# Patient Record
Sex: Female | Born: 1960 | Race: Black or African American | Hispanic: No | Marital: Married | Smoking: Never smoker
Health system: Southern US, Community
[De-identification: ages and names within clinical notes are randomized; demographics above are authoritative.]

## PROBLEM LIST (undated history)

## (undated) DIAGNOSIS — F32A Depression, unspecified: Secondary | ICD-10-CM

## (undated) DIAGNOSIS — I82409 Acute embolism and thrombosis of unspecified deep veins of unspecified lower extremity: Secondary | ICD-10-CM

## (undated) DIAGNOSIS — F419 Anxiety disorder, unspecified: Secondary | ICD-10-CM

## (undated) DIAGNOSIS — M797 Fibromyalgia: Secondary | ICD-10-CM

## (undated) HISTORY — DX: Acute embolism and thrombosis of unspecified deep veins of unspecified lower extremity: I82.409

## (undated) HISTORY — PX: MYOMECTOMY: SHX85

## (undated) HISTORY — PX: KIDNEY STONE SURGERY: SHX686

## (undated) HISTORY — DX: Anxiety disorder, unspecified: F41.9

## (undated) HISTORY — DX: Depression, unspecified: F32.A

## (undated) HISTORY — PX: PARTIAL HYSTERECTOMY: SHX80

---

## 2017-03-17 ENCOUNTER — Encounter: Payer: Self-pay | Admitting: *Deleted

## 2017-04-26 ENCOUNTER — Ambulatory Visit: Payer: Self-pay | Attending: Oncology

## 2017-05-22 DIAGNOSIS — F418 Other specified anxiety disorders: Secondary | ICD-10-CM | POA: Insufficient documentation

## 2017-05-22 DIAGNOSIS — F5104 Psychophysiologic insomnia: Secondary | ICD-10-CM | POA: Insufficient documentation

## 2017-05-22 DIAGNOSIS — M797 Fibromyalgia: Secondary | ICD-10-CM | POA: Insufficient documentation

## 2020-07-06 ENCOUNTER — Emergency Department: Payer: Managed Care, Other (non HMO)

## 2020-07-06 ENCOUNTER — Other Ambulatory Visit: Payer: Self-pay

## 2020-07-06 ENCOUNTER — Emergency Department
Admission: EM | Admit: 2020-07-06 | Discharge: 2020-07-06 | Disposition: A | Discharge status: Home / Self Care | Payer: Managed Care, Other (non HMO) | Attending: Emergency Medicine | Admitting: Emergency Medicine

## 2020-07-06 ENCOUNTER — Encounter: Payer: Self-pay | Admitting: Intensive Care

## 2020-07-06 DIAGNOSIS — R42 Dizziness and giddiness: Secondary | ICD-10-CM | POA: Insufficient documentation

## 2020-07-06 DIAGNOSIS — R0609 Other forms of dyspnea: Secondary | ICD-10-CM | POA: Insufficient documentation

## 2020-07-06 DIAGNOSIS — R000 Tachycardia, unspecified: Secondary | ICD-10-CM | POA: Diagnosis not present

## 2020-07-06 DIAGNOSIS — R0600 Dyspnea, unspecified: Secondary | ICD-10-CM

## 2020-07-06 HISTORY — DX: Fibromyalgia: M79.7

## 2020-07-06 LAB — COMPREHENSIVE METABOLIC PANEL
ALT: 22 U/L (ref 0–44)
AST: 27 U/L (ref 15–41)
Albumin: 4.1 g/dL (ref 3.5–5.0)
Alkaline Phosphatase: 61 U/L (ref 38–126)
Anion gap: 9 (ref 5–15)
BUN: 19 mg/dL (ref 6–20)
CO2: 24 mmol/L (ref 22–32)
Calcium: 9.9 mg/dL (ref 8.9–10.3)
Chloride: 109 mmol/L (ref 98–111)
Creatinine, Ser: 0.91 mg/dL (ref 0.44–1.00)
GFR, Estimated: >60 mL/min (ref >60)
Glucose, Bld: 111 mg/dL — ABNORMAL HIGH (ref 70–99)
Potassium: 3.7 mmol/L (ref 3.5–5.1)
Sodium: 142 mmol/L (ref 135–145)
Total Bilirubin: 0.6 mg/dL (ref 0.3–1.2)
Total Protein: 6.9 g/dL (ref 6.5–8.1)

## 2020-07-06 LAB — CBC WITH DIFFERENTIAL/PLATELET
Abs Immature Granulocytes: 0.01 10*3/uL (ref 0.00–0.07)
Basophils Absolute: 0.1 10*3/uL (ref 0.0–0.1)
Basophils Relative: 1 %
Eosinophils Absolute: 0.5 10*3/uL (ref 0.0–0.5)
Eosinophils Relative: 7 %
HCT: 44.3 % (ref 36.0–46.0)
Hemoglobin: 13.9 g/dL (ref 12.0–15.0)
Immature Granulocytes: 0 %
Lymphocytes Relative: 29 %
Lymphs Abs: 1.8 10*3/uL (ref 0.7–4.0)
MCH: 27.6 pg (ref 26.0–34.0)
MCHC: 31.4 g/dL (ref 30.0–36.0)
MCV: 87.9 fL (ref 80.0–100.0)
Monocytes Absolute: 0.4 10*3/uL (ref 0.1–1.0)
Monocytes Relative: 6 %
Neutro Abs: 3.5 10*3/uL (ref 1.7–7.7)
Neutrophils Relative %: 57 %
Platelets: 170 10*3/uL (ref 150–400)
RBC: 5.04 MIL/uL (ref 3.87–5.11)
RDW: 13.9 % (ref 11.5–15.5)
WBC: 6.2 10*3/uL (ref 4.0–10.5)
nRBC: 0 % (ref 0.0–0.2)

## 2020-07-06 LAB — BRAIN NATRIURETIC PEPTIDE: B Natriuretic Peptide: 6.5 pg/mL (ref 0.0–100.0)

## 2020-07-06 LAB — TROPONIN I (HIGH SENSITIVITY): Troponin I (High Sensitivity): <2 ng/L (ref <18)

## 2020-07-06 LAB — D-DIMER, QUANTITATIVE: D-Dimer, Quant: 0.34 ug/mL-FEU (ref 0.00–0.50)

## 2020-07-06 MED ORDER — ALBUTEROL SULFATE HFA 108 (90 BASE) MCG/ACT IN AERS: 1.0000 | INHALATION_SPRAY | Freq: Four times a day (QID) | RESPIRATORY_TRACT | 0 refills | Status: DC | PRN

## 2020-07-06 NOTE — ED Triage Notes (Addendum)
Patient c/o SOB and dizziness. Reports she was sent back MD due to HX of DVTs. Denies pain. 

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
## 2020-12-14 DIAGNOSIS — N3281 Overactive bladder: Secondary | ICD-10-CM | POA: Insufficient documentation

## 2020-12-14 DIAGNOSIS — G43009 Migraine without aura, not intractable, without status migrainosus: Secondary | ICD-10-CM | POA: Insufficient documentation

## 2020-12-14 DIAGNOSIS — Z86718 Personal history of other venous thrombosis and embolism: Secondary | ICD-10-CM | POA: Insufficient documentation

## 2020-12-14 DIAGNOSIS — Z90710 Acquired absence of both cervix and uterus: Secondary | ICD-10-CM | POA: Insufficient documentation

## 2022-03-30 ENCOUNTER — Emergency Department (HOSPITAL_COMMUNITY): Payer: 59

## 2022-03-30 ENCOUNTER — Emergency Department (HOSPITAL_COMMUNITY)
Admission: EM | Admit: 2022-03-30 | Discharge: 2022-03-30 | Disposition: A | Discharge status: Home / Self Care | Payer: 59 | Attending: Emergency Medicine | Admitting: Emergency Medicine

## 2022-03-30 ENCOUNTER — Encounter (HOSPITAL_COMMUNITY): Payer: Self-pay

## 2022-03-30 DIAGNOSIS — R350 Frequency of micturition: Secondary | ICD-10-CM | POA: Diagnosis not present

## 2022-03-30 DIAGNOSIS — M549 Dorsalgia, unspecified: Secondary | ICD-10-CM | POA: Insufficient documentation

## 2022-03-30 DIAGNOSIS — N200 Calculus of kidney: Secondary | ICD-10-CM

## 2022-03-30 DIAGNOSIS — R109 Unspecified abdominal pain: Secondary | ICD-10-CM | POA: Diagnosis present

## 2022-03-30 DIAGNOSIS — G8929 Other chronic pain: Secondary | ICD-10-CM | POA: Diagnosis not present

## 2022-03-30 DIAGNOSIS — N281 Cyst of kidney, acquired: Secondary | ICD-10-CM

## 2022-03-30 DIAGNOSIS — R1030 Lower abdominal pain, unspecified: Secondary | ICD-10-CM | POA: Diagnosis not present

## 2022-03-30 LAB — COMPREHENSIVE METABOLIC PANEL
ALT: 20 U/L (ref 0–44)
AST: 20 U/L (ref 15–41)
Albumin: 4.2 g/dL (ref 3.5–5.0)
Alkaline Phosphatase: 56 U/L (ref 38–126)
Anion gap: 8 (ref 5–15)
BUN: 21 mg/dL (ref 8–23)
CO2: 27 mmol/L (ref 22–32)
Calcium: 9.8 mg/dL (ref 8.9–10.3)
Chloride: 105 mmol/L (ref 98–111)
Creatinine, Ser: 0.94 mg/dL (ref 0.44–1.00)
GFR, Estimated: >60 mL/min (ref >60)
Glucose, Bld: 97 mg/dL (ref 70–99)
Potassium: 3.8 mmol/L (ref 3.5–5.1)
Sodium: 140 mmol/L (ref 135–145)
Total Bilirubin: 0.4 mg/dL (ref 0.3–1.2)
Total Protein: 6.8 g/dL (ref 6.5–8.1)

## 2022-03-30 LAB — CBC WITH DIFFERENTIAL/PLATELET
Abs Immature Granulocytes: 0.02 10*3/uL (ref 0.00–0.07)
Basophils Absolute: 0 10*3/uL (ref 0.0–0.1)
Basophils Relative: 1 %
Eosinophils Absolute: 0.2 10*3/uL (ref 0.0–0.5)
Eosinophils Relative: 3 %
HCT: 48.4 % — ABNORMAL HIGH (ref 36.0–46.0)
Hemoglobin: 14.9 g/dL (ref 12.0–15.0)
Immature Granulocytes: 0 %
Lymphocytes Relative: 20 %
Lymphs Abs: 1.2 10*3/uL (ref 0.7–4.0)
MCH: 27.6 pg (ref 26.0–34.0)
MCHC: 30.8 g/dL (ref 30.0–36.0)
MCV: 89.8 fL (ref 80.0–100.0)
Monocytes Absolute: 0.4 10*3/uL (ref 0.1–1.0)
Monocytes Relative: 6 %
Neutro Abs: 4.4 10*3/uL (ref 1.7–7.7)
Neutrophils Relative %: 70 %
Platelets: 201 10*3/uL (ref 150–400)
RBC: 5.39 MIL/uL — ABNORMAL HIGH (ref 3.87–5.11)
RDW: 13.5 % (ref 11.5–15.5)
WBC: 6.2 10*3/uL (ref 4.0–10.5)
nRBC: 0 % (ref 0.0–0.2)

## 2022-03-30 LAB — URINALYSIS, ROUTINE W REFLEX MICROSCOPIC
Bilirubin Urine: NEGATIVE
Glucose, UA: NEGATIVE mg/dL
Hgb urine dipstick: NEGATIVE
Ketones, ur: NEGATIVE mg/dL
Leukocytes,Ua: NEGATIVE
Nitrite: NEGATIVE
Protein, ur: NEGATIVE mg/dL
Specific Gravity, Urine: 1.018 (ref 1.005–1.030)
pH: 6 (ref 5.0–8.0)

## 2022-03-30 MED ORDER — HYDROCODONE-ACETAMINOPHEN 5-325 MG PO TABS
1.0000 | ORAL_TABLET | Freq: Once | ORAL | Status: AC
  Administered 2022-03-30: 1 via ORAL
  Filled 2022-03-30: qty 1

## 2022-03-30 MED ORDER — KETOROLAC TROMETHAMINE 60 MG/2ML IM SOLN
60.0000 mg | Freq: Once | INTRAMUSCULAR | Status: AC
  Administered 2022-03-30: 60 mg via INTRAMUSCULAR
  Filled 2022-03-30: qty 2

## 2022-03-30 MED ORDER — TRAMADOL HCL 50 MG PO TABS: 50.0000 mg | ORAL_TABLET | Freq: Four times a day (QID) | ORAL | 0 refills | Status: DC | PRN

## 2022-03-30 NOTE — ED Provider Notes (Signed)
[facility] DEPT Provider Note   CSN: [id number] Arrival date & time: 03/30/22  1356     History  Chief Complaint  Patient presents with   Back Pain    Cristina Mcdonald is a 62 y.o. female history of kidney stones here presenting with flank pain.  Patient has history of fibromyalgia also is in pain all the time.  She states that for the last several days she has worsening flank pain radiated down to the groin.  She has some urinary frequency as well.  Patient denies any hematuria.  She states that she has previous kidney stones and interventions done before that made her septic.  She moved here from [place] and does not have a urologist.  The history is provided by the patient.       Home Medications Prior to Admission medications   Medication Sig Start Date End Date Taking? Authorizing Provider  albuterol (VENTOLIN HFA) 108 (90 Base) MCG/ACT inhaler Inhale 1-2 puffs into the lungs every 6 (six) hours as needed for wheezing or shortness of breath. 07/06/20   Vladimir Crofts, MD      Allergies    Iodine and Shellfish allergy    Review of Systems   Review of Systems  Musculoskeletal:  Positive for back pain.  All other systems reviewed and are negative.   Physical Exam Updated Vital Signs BP 114/78 (BP Location: Left Arm)   Pulse 87   Temp 98.6 F (37 C) (Oral)   Resp 16   SpO2 98%  Physical Exam Vitals and nursing note reviewed.  Constitutional:      Appearance: Normal appearance.  HENT:     Head: Normocephalic.     Nose: Nose normal.     Mouth/Throat:     Mouth: Mucous membranes are moist.  Eyes:     Extraocular Movements: Extraocular movements intact.     Pupils: Pupils are equal, round, and reactive to light.  Cardiovascular:     Rate and Rhythm: Normal rate and regular rhythm.     Pulses: Normal pulses.     Heart sounds: Normal heart sounds.  Pulmonary:     Effort: Pulmonary effort is normal.     Breath sounds: Normal breath  sounds.  Abdominal:     General: Abdomen is flat.     Palpations: Abdomen is soft.     Comments: Mild suprapubic tenderness no obvious CVA tenderness  Musculoskeletal:     Cervical back: Normal range of motion and neck supple.  Skin:    General: Skin is warm.     Capillary Refill: Capillary refill takes less than 2 seconds.  Neurological:     General: No focal deficit present.     Mental Status: She is alert.  Psychiatric:        Mood and Affect: Mood normal.        Behavior: Behavior normal.     ED Results / Procedures / Treatments   Labs (all labs ordered are listed, but only abnormal results are displayed) Labs Reviewed  CBC WITH DIFFERENTIAL/PLATELET - Abnormal; Notable for the following components:      Result Value   RBC 5.39 (*)    HCT 48.4 (*)    All other components within normal limits  URINE CULTURE  COMPREHENSIVE METABOLIC PANEL  URINALYSIS, ROUTINE W REFLEX MICROSCOPIC    EKG None  Radiology CT Renal Stone Study  Result Date: 03/30/2022 CLINICAL DATA:  Back and bilateral flank pain, dysuria, and urgency EXAM:  CT ABDOMEN AND PELVIS WITHOUT CONTRAST TECHNIQUE: Multidetector CT imaging of the abdomen and pelvis was performed following the standard protocol without IV contrast. RADIATION DOSE REDUCTION: This exam was performed according to the departmental dose-optimization program which includes automated exposure control, adjustment of the mA and/or kV according to patient size and/or use of iterative reconstruction technique. COMPARISON:  None Available. FINDINGS: Lower chest: Right lower lobe subsegmental atelectasis. No pleural effusion or pneumothorax demonstrated. Partially imaged heart size is normal. Hepatobiliary: No focal hepatic lesions. No intra or extrahepatic biliary ductal dilation. Normal gallbladder. Pancreas: No focal lesions or main ductal dilation. Spleen: The superior most aspect of the spleen is not included within the field of view. No focal  splenic lesion in the remainder of the partially imaged spleen. Adrenals/Urinary Tract: No adrenal nodules. No hydronephrosis. Asymmetrically smaller right kidney with cortical scarring. Multiple bilateral punctate nonobstructing renal stones. Bilateral hypodensities, most likely cysts. An exophytic hyperattenuating focus along the posterior left lower pole, likely hemorrhagic/proteinaceous cyst. No focal bladder wall thickening. Stomach/Bowel: Normal appearance of the stomach. No evidence of bowel wall thickening, distention, or inflammatory changes. Normal appendix. Vascular/Lymphatic: No significant vascular findings are present. No enlarged abdominal or pelvic lymph nodes. Reproductive: No adnexal masses. Other: No free fluid, fluid collection, or free air. Musculoskeletal: Grade 1 anterolisthesis at L5-S1. IMPRESSION: 1. No acute intra-abdominal or intrapelvic abnormality. 2. Bilateral punctate nonobstructing renal stones. 3. Asymmetrically smaller right kidney with cortical scarring, likely sequela of prior infection. 4. Bilateral renal hypodensities, most likely cysts. Nonemergent renal protocol MRI or CT can be considered for further evaluation. 5. Grade 1 anterolisthesis at L5-S1. 6. The superior most aspect of the spleen is not included within the field of view. No focal splenic lesion in the remainder of the partially imaged spleen. Electronically Signed   By: Darrin Nipper M.D.   On: 03/30/2022 15:26    Procedures Procedures    Medications Ordered in ED Medications  ketorolac (TORADOL) injection 60 mg (has no administration in time range)  HYDROcodone-acetaminophen (NORCO/VICODIN) 5-325 MG per tablet 1 tablet (1 tablet Oral Given 03/30/22 1641)    ED Course/ Medical Decision Making/ A&P                           Medical Decision Making Cristina Mcdonald is a 62 y.o. female here presenting with dysuria and flank pain.  Patient has chronic back pain from her fibromyalgia.  She has been having  worsening urinary frequency.  Patient has history of kidney stones.  Concern for possible kidney stones versus UTI.  Plan to get CBC and CMP and UA and CT renal stone.  5:13 PM I reviewed patient's labs and independently interpreted imaging studies.  CBC and CMP unremarkable.  Urinalysis unremarkable.  CT showed bilateral punctate nonobstructing stones with no hydro-.  Patient also has cysts in her kidney.  I wonder if she passed a kidney stone and I recommend NSAIDs.  Will refer to [facility].  Problems Addressed: Kidney stone: chronic illness or injury Renal cyst: chronic illness or injury  Amount and/or Complexity of Data Reviewed Labs: ordered. Decision-making details documented in ED Course. Radiology: ordered and independent interpretation performed. Decision-making details documented in ED Course.  Risk Prescription drug management.    Final Clinical Impression(s) / ED Diagnoses Final diagnoses:  None    Rx / DC Orders ED Discharge Orders     None         Darl Householder,  Gonzella Lex, MD 03/30/22 [phone]

## 2022-03-30 NOTE — Discharge Instructions (Signed)
As we discussed, you have stones inside your kidney which is not likely causing your pain.  You may have passed some small kidney stones that was causing your pain.  I recommend taking Motrin for pain and Ultram for severe pain.  I have referred you to urology for follow-up.  Please call their number tomorrow  Return to ER if you have severe back pain, trouble urinating, blood in your urine, fever

## 2022-03-30 NOTE — ED Provider Triage Note (Signed)
Emergency Medicine Provider Triage Evaluation Note  Cristina Mcdonald , a 62 y.o. female  was evaluated in triage.  Pt complains of back and flank pain bilaterally, dysuria, urgency. No hematuria. Hx of kidney stones.  Review of Systems  Positive: Urinary urgency, frequency Negative: hematuria  Physical Exam  BP 114/78 (BP Location: Left Arm)   Pulse 87   Temp 98.6 F (37 C) (Oral)   Resp 16   SpO2 98%  Gen:   Awake, no distress   Resp:  Normal effort  MSK:   Moves extremities without difficulty  Other:  Ttp of bilateral flanks  Medical Decision Making  Medically screening exam initiated at 2:11 PM.  Appropriate orders placed.  Cristina Mcdonald was informed that the remainder of the evaluation will be completed by another provider, this initial triage assessment does not replace that evaluation, and the importance of remaining in the ED until their evaluation is complete.     Cristina Mcdonald, [place] 03/30/22 1413

## 2022-03-30 NOTE — ED Notes (Signed)
Pt arrived via POV, c/o back pain, and abd pain with dysuria and urgency frequency. Denies any unilateral flank pain, denies any blood in urine

## 2022-03-31 LAB — URINE CULTURE: Culture: <10000 — AB

## 2022-05-23 IMAGING — CR DG CHEST 2V
1 series · 2 of 2 positions shown · non-contrast
Comparison: None.

CLINICAL DATA: Shortness of breath. Known DVT. Pulmonary
congestion.

EXAM:
CHEST - 2 VIEW

[Series 1: w chest pa · 0.14mm/px · 2 of 2 slices shown]
[im 1/2]
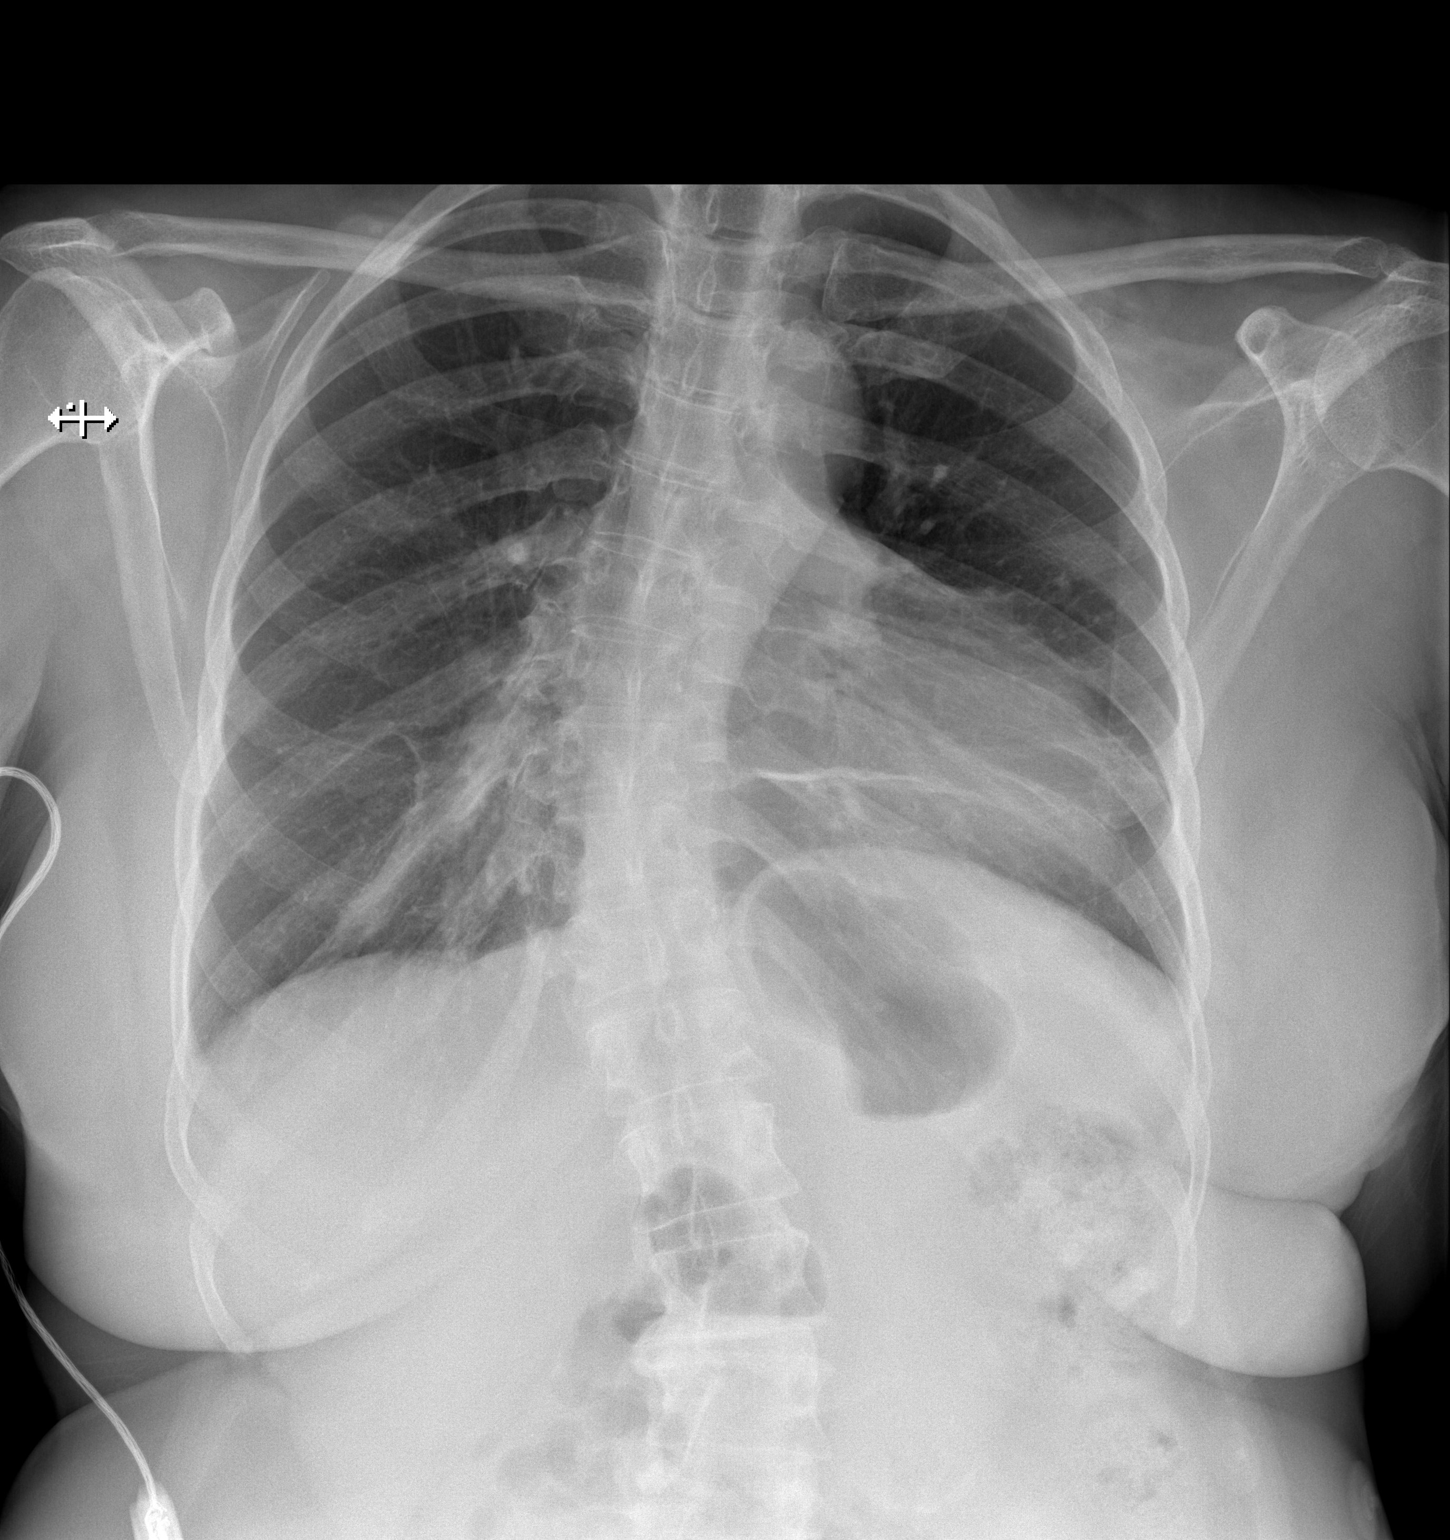
[im 2/2]
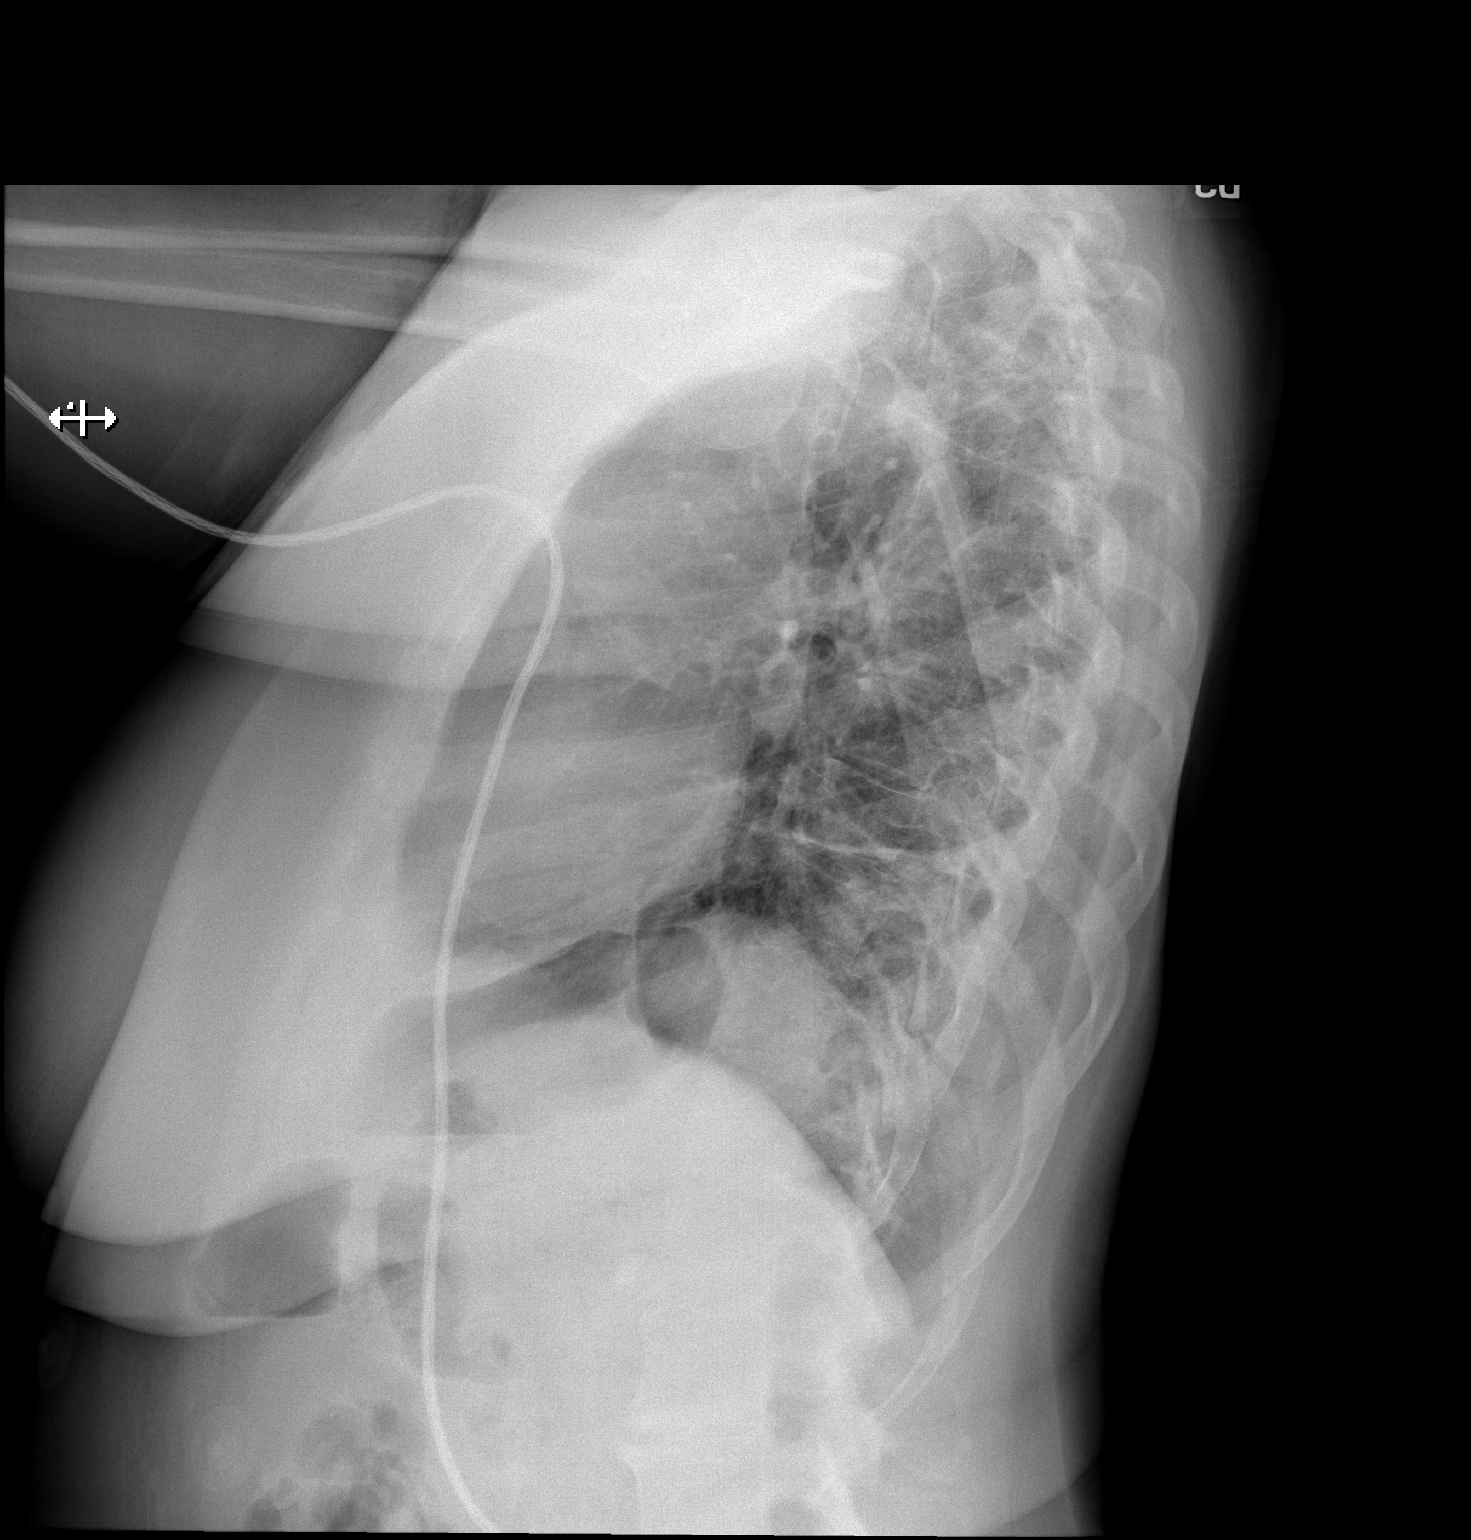

[2 of 2 positions shown; findings below may reference images not displayed]

FINDINGS: Heart is mildly enlarged. Focal airspace opacity is noted in the
posteromedial left lower lobe and medial right lower lobe. Upper
lobes are clear bilaterally. Dextroconvex scoliosis is present in
the thoracic spine.
IMPRESSION: 1. Bilateral lower lobe airspace disease. Differential diagnosis
includes atelectasis, potentially associated with pulmonary emboli,
infection, and aspiration.
2. Mild cardiomegaly without failure.

## 2022-07-18 DIAGNOSIS — M47812 Spondylosis without myelopathy or radiculopathy, cervical region: Secondary | ICD-10-CM | POA: Insufficient documentation

## 2022-09-26 ENCOUNTER — Ambulatory Visit: Payer: 59 | Admitting: Family Medicine

## 2022-10-05 ENCOUNTER — Ambulatory Visit: Payer: 59 | Admitting: Internal Medicine

## 2023-02-03 ENCOUNTER — Emergency Department (HOSPITAL_COMMUNITY): Payer: 59

## 2023-02-03 ENCOUNTER — Other Ambulatory Visit: Payer: Self-pay

## 2023-02-03 ENCOUNTER — Encounter (HOSPITAL_COMMUNITY): Payer: Self-pay | Admitting: Emergency Medicine

## 2023-02-03 ENCOUNTER — Emergency Department (HOSPITAL_BASED_OUTPATIENT_CLINIC_OR_DEPARTMENT_OTHER): Payer: 59

## 2023-02-03 ENCOUNTER — Emergency Department (HOSPITAL_COMMUNITY)
Admission: EM | Admit: 2023-02-03 | Discharge: 2023-02-03 | Disposition: A | Discharge status: Home / Self Care | Payer: 59

## 2023-02-03 DIAGNOSIS — R202 Paresthesia of skin: Secondary | ICD-10-CM | POA: Insufficient documentation

## 2023-02-03 DIAGNOSIS — S39012A Strain of muscle, fascia and tendon of lower back, initial encounter: Secondary | ICD-10-CM | POA: Insufficient documentation

## 2023-02-03 DIAGNOSIS — S161XXA Strain of muscle, fascia and tendon at neck level, initial encounter: Secondary | ICD-10-CM | POA: Insufficient documentation

## 2023-02-03 DIAGNOSIS — M79604 Pain in right leg: Secondary | ICD-10-CM | POA: Insufficient documentation

## 2023-02-03 DIAGNOSIS — X58XXXA Exposure to other specified factors, initial encounter: Secondary | ICD-10-CM | POA: Insufficient documentation

## 2023-02-03 DIAGNOSIS — M7989 Other specified soft tissue disorders: Secondary | ICD-10-CM | POA: Diagnosis not present

## 2023-02-03 DIAGNOSIS — M79605 Pain in left leg: Secondary | ICD-10-CM | POA: Diagnosis not present

## 2023-02-03 DIAGNOSIS — M79601 Pain in right arm: Secondary | ICD-10-CM | POA: Insufficient documentation

## 2023-02-03 DIAGNOSIS — M79602 Pain in left arm: Secondary | ICD-10-CM | POA: Diagnosis not present

## 2023-02-03 DIAGNOSIS — S199XXA Unspecified injury of neck, initial encounter: Secondary | ICD-10-CM | POA: Diagnosis present

## 2023-02-03 LAB — BASIC METABOLIC PANEL
Anion gap: 6 (ref 5–15)
BUN: 19 mg/dL (ref 8–23)
CO2: 30 mmol/L (ref 22–32)
Calcium: 10.6 mg/dL — ABNORMAL HIGH (ref 8.9–10.3)
Chloride: 103 mmol/L (ref 98–111)
Creatinine, Ser: 0.82 mg/dL (ref 0.44–1.00)
GFR, Estimated: >60 mL/min (ref >60)
Glucose, Bld: 96 mg/dL (ref 70–99)
Potassium: 5 mmol/L (ref 3.5–5.1)
Sodium: 139 mmol/L (ref 135–145)

## 2023-02-03 LAB — CBC
HCT: 48.3 % — ABNORMAL HIGH (ref 36.0–46.0)
Hemoglobin: 15 g/dL (ref 12.0–15.0)
MCH: 27.4 pg (ref 26.0–34.0)
MCHC: 31.1 g/dL (ref 30.0–36.0)
MCV: 88.3 fL (ref 80.0–100.0)
Platelets: 162 10*3/uL (ref 150–400)
RBC: 5.47 MIL/uL — ABNORMAL HIGH (ref 3.87–5.11)
RDW: 13.6 % (ref 11.5–15.5)
WBC: 5.3 10*3/uL (ref 4.0–10.5)
nRBC: 0 % (ref 0.0–0.2)

## 2023-02-03 MED ORDER — NAPROXEN 500 MG PO TABS: 500.0000 mg | ORAL_TABLET | Freq: Two times a day (BID) | ORAL | 0 refills | Status: DC

## 2023-02-03 MED ORDER — OXYCODONE-ACETAMINOPHEN 5-325 MG PO TABS
1.0000 | ORAL_TABLET | Freq: Once | ORAL | Status: AC
  Administered 2023-02-03: 1 via ORAL
  Filled 2023-02-03: qty 1

## 2023-02-03 MED ORDER — METHOCARBAMOL 500 MG PO TABS: 500.0000 mg | ORAL_TABLET | Freq: Two times a day (BID) | ORAL | 0 refills | Status: DC

## 2023-02-03 MED ORDER — NAPROXEN 500 MG PO TABS
500.0000 mg | ORAL_TABLET | Freq: Two times a day (BID) | ORAL | 0 refills | Status: DC
Start: 2023-02-03 — End: 2023-02-03

## 2023-02-03 NOTE — ED Triage Notes (Addendum)
Patient presents with bilateral leg pain, she has had for 3 weeks, that keeps her from sleeping at night. The pain radiates down her legs and leads to tingling in the feet. She can not lay on either side and has heaviness in her calves.  She also complains of pain in the right side of her neck and mid to lower back. She has seen a therapist about her neck, but stopped going. She was told she needed an MRI.  Denies shortness of breath.  HX: blood clots, kidney stones

## 2023-02-03 NOTE — ED Provider Notes (Signed)
EMERGENCY DEPARTMENT AT [facility] Provider Note   CSN: [id number] Arrival date & time: 02/03/23  1208     History  Chief Complaint  Patient presents with   Leg Pain   Back Pain   Neck Pain    Cristina Mcdonald is a 62 y.o. female.  62 year old female with past medical history of DVT in the past as well as fibromyalgia presenting to the emergency department today with pain in her neck and back as well as some paresthesias.  The patient states that this is been going on now for the past few weeks.  She was going to physical therapy for her neck.  She was told that she would need to obtain imaging before this could continue due to pain.  The patient denies any bowel or bladder dysfunction and has not had any saddle anesthesia.  She denies any focal weakness but states that her legs do feel weak compared to her baseline at times.  She denies any saddle anesthesia.  She states that she does have numbness and tingling in her hands and feet occasionally.  She does take gabapentin and has been taking tramadol for this with minimal improvement.  The patient denies any history of IV drug abuse.  Skin to the ER today for further evaluation due to these ongoing symptoms.   Leg Pain Associated symptoms: back pain and neck pain   Back Pain Associated symptoms: leg pain and numbness   Neck Pain Associated symptoms: leg pain and numbness        Home Medications Prior to Admission medications   Medication Sig Start Date End Date Taking? Authorizing Provider  methocarbamol (ROBAXIN) 500 MG tablet Take 1 tablet (500 mg total) by mouth 2 (two) times daily. 02/03/23  Yes Durwin Glaze, MD  naproxen (NAPROSYN) 500 MG tablet Take 1 tablet (500 mg total) by mouth 2 (two) times daily. 02/03/23  Yes Durwin Glaze, MD  albuterol (VENTOLIN HFA) 108 (90 Base) MCG/ACT inhaler Inhale 1-2 puffs into the lungs every 6 (six) hours as needed for wheezing or shortness of breath. 07/06/20    Delton Prairie, MD  traMADol (ULTRAM) 50 MG tablet Take 1 tablet (50 mg total) by mouth every 6 (six) hours as needed. 03/30/22   Charlynne Pander, MD      Allergies    Iodine and Shellfish allergy    Review of Systems   Review of Systems  Musculoskeletal:  Positive for back pain and neck pain.  Neurological:  Positive for numbness.  All other systems reviewed and are negative.   Physical Exam Updated Vital Signs BP (!) 128/90 (BP Location: Left Arm)   Pulse 80   Temp 98.1 F (36.7 C) (Oral)   Resp 16   SpO2 93%  Physical Exam Vitals and nursing note reviewed.   Gen: NAD Eyes: PERRL, EOMI HEENT: no oropharyngeal swelling Neck: trachea midline Resp: clear to auscultation bilaterally Card: RRR, no murmurs, rubs, or gallops Abd: nontender, nondistended Extremities: The patient does report bilateral calf tenderness MSK: The patient is tender over the mid cervical spine as well as the mid lumbar spine with no step-offs or deformities noted Vascular: 2+ radial pulses bilaterally, 2+ DP pulses bilaterally Neuro: Patient has equal strength sensation throughout the bilateral upper and lower extremities with no dysmetria on finger-to-nose testing Skin: no rashes Psyc: acting appropriately   ED Results / Procedures / Treatments   Labs (all labs ordered are listed, but only abnormal results  are displayed) Labs Reviewed  BASIC METABOLIC PANEL - Abnormal; Notable for the following components:      Result Value   Calcium 10.6 (*)    All other components within normal limits  CBC - Abnormal; Notable for the following components:   RBC 5.47 (*)    HCT 48.3 (*)    All other components within normal limits    EKG None  Radiology VAS [place] LOWER EXTREMITY VENOUS (DVT) (ONLY [facility] & [facility])  Result Date: 02/03/2023  Lower Venous DVT Study Patient Name:  Cristina Mcdonald  Date of Exam:   02/03/2023 Medical Rec #: [id number]       Accession #:    [id number] Date of Birth: 30-Oct-1960        Patient Gender: F Patient Age:   45 years Exam Location:  [facility] Procedure:      VAS [place] LOWER EXTREMITY VENOUS (DVT) Referring Phys: Greig Castilla Geniva Lohnes --------------------------------------------------------------------------------  Indications: Pain.  Risk Factors: DVT HX of DVT post trauma 20 years ago per patient. Comparison Study: Previous exam on 03/02/2012 was negative for DVT Performing Technologist: Ernestene Mention RVT, RDMS  Examination Guidelines: A complete evaluation includes B-mode imaging, spectral Doppler, color Doppler, and power Doppler as needed of all accessible portions of each vessel. Bilateral testing is considered an integral part of a complete examination. Limited examinations for reoccurring indications may be performed as noted. The reflux portion of the exam is performed with the patient in reverse Trendelenburg.  +---------+---------------+---------+-----------+----------+--------------+ RIGHT    CompressibilityPhasicitySpontaneityPropertiesThrombus Aging +---------+---------------+---------+-----------+----------+--------------+ CFV      Full           Yes      Yes                                 +---------+---------------+---------+-----------+----------+--------------+ SFJ      Full                                                        +---------+---------------+---------+-----------+----------+--------------+ FV Prox  Full           Yes      Yes                                 +---------+---------------+---------+-----------+----------+--------------+ FV Mid   Full           Yes      Yes                                 +---------+---------------+---------+-----------+----------+--------------+ FV DistalFull           Yes      Yes                                 +---------+---------------+---------+-----------+----------+--------------+ PFV      Full                                                         +---------+---------------+---------+-----------+----------+--------------+  POP      Full           Yes      Yes                                 +---------+---------------+---------+-----------+----------+--------------+ PTV      Full                                                        +---------+---------------+---------+-----------+----------+--------------+ PERO     Full                                                        +---------+---------------+---------+-----------+----------+--------------+   +---------+---------------+---------+-----------+----------+--------------+ LEFT     CompressibilityPhasicitySpontaneityPropertiesThrombus Aging +---------+---------------+---------+-----------+----------+--------------+ CFV      Full           Yes      Yes                                 +---------+---------------+---------+-----------+----------+--------------+ SFJ      Full                                                        +---------+---------------+---------+-----------+----------+--------------+ FV Prox  Full           Yes      Yes                                 +---------+---------------+---------+-----------+----------+--------------+ FV Mid   Full           Yes      Yes                                 +---------+---------------+---------+-----------+----------+--------------+ FV DistalFull           Yes      Yes                                 +---------+---------------+---------+-----------+----------+--------------+ PFV      Full                                                        +---------+---------------+---------+-----------+----------+--------------+ POP      Full           Yes      Yes                                 +---------+---------------+---------+-----------+----------+--------------+ PTV  Full                                                         +---------+---------------+---------+-----------+----------+--------------+ PERO     Full                                                        +---------+---------------+---------+-----------+----------+--------------+     Summary: BILATERAL: - No evidence of deep vein thrombosis seen in the lower extremities, bilaterally. -No evidence of popliteal cyst, bilaterally.   *See table(s) above for measurements and observations.    Preliminary     Procedures Procedures    Medications Ordered in ED Medications  oxyCODONE-acetaminophen (PERCOCET/ROXICET) 5-325 MG per tablet 1 tablet (1 tablet Oral Given 02/03/23 1317)    ED Course/ Medical Decision Making/ A&P                                 Medical Decision Making 62 year old female with past medical history of fibromyalgia and DVT in the past presenting to the emergency department today with pain in her arms and legs as well as pain in her back and neck.  The patient does not any red flag symptoms for cauda equina syndrome or cord compressing lesion at this time.  I will obtain an MRI of her cervical spine and lumbar spine for further evaluation for her radicular symptoms.  Will obtain ultrasounds of her bilateral lower extremities.  She does not have any symptoms consistent with pulmonary embolism at this time.  Given the paresthesias will also obtain basic labs to eval for electrolyte abnormalities.  Give patient Percocet for pain and reevaluate for ultimate disposition.  The patient's DVT study is negative.  MRI studies are pending.  Anticipate discharge.  Signed out to Dr. Adela Lank at time of signout.  Amount and/or Complexity of Data Reviewed Labs: ordered. Radiology: ordered.  Risk Prescription drug management.           Final Clinical Impression(s) / ED Diagnoses Final diagnoses:  Lumbar strain, initial encounter  Acute strain of neck muscle, initial encounter    Rx / DC Orders ED Discharge Orders          Ordered     naproxen (NAPROSYN) 500 MG tablet  2 times daily        02/03/23 1500    methocarbamol (ROBAXIN) 500 MG tablet  2 times daily        02/03/23 1500              Durwin Glaze, MD 02/03/23 1542

## 2023-02-03 NOTE — Discharge Instructions (Addendum)
Your workup today was reassuring.  Please follow-up with your doctor.  Take the naproxen twice daily as well as the muscle relaxer in addition to your pain medication at home.  Return to the ER for worsening symptoms.  As you are having some radicular symptoms I have given you information to follow-up with the neurosurgeon in the office.  Please call them in the next working day to try and set up an appointment to be seen.

## 2023-02-03 NOTE — Progress Notes (Signed)
BLE venous duplex has been completed.  Preliminary results given to Dr. Rhae Hammock.    Results can be found under chart review under CV PROC. 02/03/2023 2:19 PM Ludene Stokke RVT, RDMS

## 2023-02-03 NOTE — ED Provider Notes (Signed)
Received patient in turnover from Dr. Rhae Hammock.  Please see their note for further details of Hx, PE.  Briefly patient is a 62 y.o. female with a Leg Pain, Back Pain, and Neck Pain .  Patient really sent in for an MRI.  Plan to obtain an MRI.  MRI without any obvious acute findings.  Given neurosurgery follow-up.     Melene Plan, DO 02/03/23 1729

## 2023-03-28 ENCOUNTER — Telehealth: Payer: Self-pay | Admitting: Neurosurgery

## 2023-03-28 NOTE — Telephone Encounter (Signed)
 Left message to call back

## 2023-03-28 NOTE — Telephone Encounter (Signed)
 Patient was seen at [facility] on 02/08/23-her note is scanned. She was offered cervical discectomy and fusion. Her brother in-law Ozell Flatter FMW#[phone] had surgery with Dr.Yarbrough and told her to see him for a 2nd opinion. Her MRI was done at [facility]. She did PT in the past year she will email me that information. No injections for  several years. Could she see Dr.Y directly?

## 2023-03-29 NOTE — Telephone Encounter (Signed)
 She confirmed 04/11/2023.

## 2023-04-07 NOTE — Telephone Encounter (Signed)
PT notes scanned into her chart.

## 2023-04-10 ENCOUNTER — Encounter: Payer: Self-pay | Admitting: Neurosurgery

## 2023-04-10 NOTE — Progress Notes (Unsigned)
Referring Physician:  [place], [place] [address] [place],  [place] [address]  Primary Physician:  Dy, Maurice March, MD  History of Present Illness: 04/11/2023 Ms. Cristina Mcdonald is here today with a chief complaint of neck pain along both sides of her neck for years.  She gets some pain in between her shoulder blades as well as into her thighs and calves.  She has numbness in the arms.  She gets pain into her R arm and numbness in her hand in all 5 fingers. She has been dropping items. She has tingling in her hands.  Looking down and bending over make her pain worse.  Heat helps somewhat.  She has had low back pain for many years.  She gets pain in her bilateral calves posteriorly.  This particularly occurs at night.  She reports heaviness in her legs.  She sometimes has trouble with walking.  Sometimes sitting long times causes her back pain or leg pain to get worse.  Bowel/Bladder Dysfunction: none  Conservative measures:  Physical therapy: has participated in PT from [facility] including regular antiinflammatories: Flexeril, Gabapentin, Naproxen, Methocarbamol   Injections: no epidural steroid injections  Past Surgery: none  Cristina Mcdonald has no symptoms of cervical myelopathy.  The symptoms are causing a significant impact on the patient's life.   I have utilized the care everywhere function in epic to review the outside records available from external health systems.  Review of Systems:  A 10 point review of systems is negative, except for the pertinent positives and negatives detailed in the HPI.  Past Medical History: Past Medical History:  Diagnosis Date   Anxiety    Depression    DVT (deep venous thrombosis) (HCC)    Fibromyalgia     Past Surgical History: Past Surgical History:  Procedure Laterality Date   KIDNEY STONE SURGERY     MYOMECTOMY     PARTIAL HYSTERECTOMY      Allergies: Allergies as of 04/11/2023  - Review Complete 04/11/2023  Allergen Reaction Noted   Iodine  07/06/2020   Shellfish allergy  07/06/2020    Medications:  Current Outpatient Medications:    clonazePAM (KLONOPIN) 0.5 MG tablet, Take 0.25 mg by mouth at bedtime., Disp: , Rfl:    cyclobenzaprine (FLEXERIL) 10 MG tablet, Take 1 tablet by mouth 3 (three) times daily as needed., Disp: , Rfl:    gabapentin (NEURONTIN) 600 MG tablet, TAKE 1 TABLET BY MOUTH IN THE MORNING AND 1 IN THE AFTERNOON AND 2 EVERY DAY AT BEDTIME, Disp: , Rfl:    traZODone (DESYREL) 150 MG tablet, , Disp: , Rfl:    albuterol (VENTOLIN HFA) 108 (90 Base) MCG/ACT inhaler, Inhale 1-2 puffs into the lungs every 6 (six) hours as needed for wheezing or shortness of breath. (Patient not taking: Reported on 04/11/2023), Disp: 8 g, Rfl: 0   Melatonin 2.5 MG CHEW, Chew 2.5 mg by mouth as needed., Disp: , Rfl:    meloxicam (MOBIC) 15 MG tablet, Take 15 mg by mouth daily as needed., Disp: , Rfl:    methocarbamol (ROBAXIN) 500 MG tablet, Take 1 tablet (500 mg total) by mouth 2 (two) times daily. (Patient not taking: Reported on 04/11/2023), Disp: 20 tablet, Rfl: 0   naproxen (NAPROSYN) 500 MG tablet, Take 1 tablet (500 mg total) by mouth 2 (two) times daily. (Patient not taking: Reported on 04/11/2023), Disp: 30 tablet, Rfl: 0  Social History: Social History  Tobacco Use   Smoking status: Never   Smokeless tobacco: Never  Substance Use Topics   Alcohol use: Never   Drug use: Never    Family Medical History: History reviewed. No pertinent family history.  Physical Examination: Vitals:   04/11/23 1500  BP: 132/70    General: Patient is in no apparent distress. Attention to examination is appropriate.  Neck:   Supple.  Full range of motion.  Respiratory: Patient is breathing without any difficulty.   NEUROLOGICAL:     Awake, alert, oriented to person, place, and time.  Speech is clear and fluent.   Cranial Nerves: Pupils equal round and reactive to  light.  Facial tone is symmetric.  Facial sensation is symmetric. Shoulder shrug is symmetric. Tongue protrusion is midline.  There is no pronator drift.  Strength: Side Biceps Triceps Deltoid Interossei Grip Wrist Ext. Wrist Flex.  R 5 5 5 5 5 5 5   L 5 5 5 5 5 5 5    Side Iliopsoas Quads Hamstring PF DF EHL  R 5 5 5 5 5 5   L 5 5 5 5 5 5    Reflexes are 1+ and symmetric at the biceps, triceps, brachioradialis, patella and achilles.   Hoffman's is absent.   Bilateral upper and lower extremity sensation is intact to light touch.    No evidence of dysmetria noted.  Gait is normal.     Medical Decision Making  Imaging: MRI CL spine 02/03/2023 Disc levels:   T12-L1: No significant disc protrusion, foraminal stenosis, or canal stenosis.   L1-L2: No significant disc protrusion, foraminal stenosis, or canal stenosis.   L2-L3: Disc height loss and desiccation disc bulges endplate spurring. Right greater than left facet arthropathy. Resulting mild right foraminal stenosis.   L3-L4: Mild disc bulging. Facet arthropathy. Patent canal and foramina.   L4-L5: Disc bulging.  Facet arthropathy.  No significant stenosis.   L5-S1: Grade 1 anterolisthesis. Severe bilateral facet arthropathy. Uncovering of the disc and disc bulging. Moderate left and mild right foraminal stenosis.   IMPRESSION: MRI cervical spine:   1. At C4-C5, moderate left and mild right foraminal stenosis. Mild canal stenosis. 2. At C5-C6, mild to moderate bilateral foraminal stenosis. Mild canal stenosis. 3. At C6-C7, mild-to-moderate right and mild left foraminal stenosis. Mild canal stenosis.   MRI lumbar spine:   At L5-S1, moderate left and mild right foraminal stenosis. Severe facet arthropathy with grade 1 anterolisthesis.     Electronically Signed   By: Feliberto Harts M.D.   On: 02/03/2023 17:19  In addition to above, please note that she has severe degenerative disc disease between C4 and C7 in  the neck.  I have personally reviewed the images and agree with the above interpretation.  Assessment and Plan: Ms. Cristina Mcdonald is a pleasant 63 y.o. female with cervicalgia, cervical radiculopathy due to stenosis, and severe degenerative disc disease in the cervical spine.  She additionally has spondylolisthesis of the lumbar spine causing lumbar radiculopathy.  She has tried and failed conservative management with physical therapy.  I have recommended flexion-extension x-rays of her cervical spine and lumbar spine.  Depending on the outcome of these results, she may be a candidate for surgical intervention.  I spent a total of 30 minutes in this patient's care today. This time was spent reviewing pertinent records including imaging studies, obtaining and confirming history, performing a directed evaluation, formulating and discussing my recommendations, and documenting the visit within the medical record.  Thank you for involving me in the care of this patient.      Devin Foskey K. Myer Haff MD, [facility]

## 2023-04-11 ENCOUNTER — Ambulatory Visit
Admission: RE | Admit: 2023-04-11 | Discharge: 2023-04-11 | Disposition: A | Discharge status: Home / Self Care | Payer: 59 | Attending: Neurosurgery | Admitting: Neurosurgery

## 2023-04-11 ENCOUNTER — Ambulatory Visit
Admission: RE | Admit: 2023-04-11 | Discharge: 2023-04-11 | Disposition: A | Discharge status: Home / Self Care | Payer: 59 | Source: Ambulatory Visit | Attending: Neurosurgery | Admitting: Neurosurgery

## 2023-04-11 ENCOUNTER — Ambulatory Visit: Payer: 59 | Admitting: Neurosurgery

## 2023-04-11 ENCOUNTER — Encounter: Payer: Self-pay | Admitting: Neurosurgery

## 2023-04-11 DIAGNOSIS — M5416 Radiculopathy, lumbar region: Secondary | ICD-10-CM | POA: Diagnosis present

## 2023-04-11 DIAGNOSIS — M542 Cervicalgia: Secondary | ICD-10-CM | POA: Insufficient documentation

## 2023-04-11 DIAGNOSIS — M5412 Radiculopathy, cervical region: Secondary | ICD-10-CM

## 2023-04-11 DIAGNOSIS — M4316 Spondylolisthesis, lumbar region: Secondary | ICD-10-CM

## 2023-04-11 DIAGNOSIS — M5030 Other cervical disc degeneration, unspecified cervical region: Secondary | ICD-10-CM | POA: Diagnosis not present

## 2023-04-11 DIAGNOSIS — M4802 Spinal stenosis, cervical region: Secondary | ICD-10-CM | POA: Diagnosis not present

## 2023-04-20 DIAGNOSIS — L661 Lichen planopilaris, unspecified: Secondary | ICD-10-CM | POA: Insufficient documentation

## 2023-05-18 ENCOUNTER — Ambulatory Visit: Payer: 59 | Admitting: Neurosurgery

## 2023-05-29 ENCOUNTER — Ambulatory Visit: Admitting: Podiatry

## 2023-05-31 ENCOUNTER — Ambulatory Visit: Admitting: Podiatry

## 2023-06-01 ENCOUNTER — Ambulatory Visit (INDEPENDENT_AMBULATORY_CARE_PROVIDER_SITE_OTHER): Admitting: Podiatry

## 2023-06-01 ENCOUNTER — Encounter: Payer: Self-pay | Admitting: Podiatry

## 2023-06-01 DIAGNOSIS — G619 Inflammatory polyneuropathy, unspecified: Secondary | ICD-10-CM | POA: Insufficient documentation

## 2023-06-01 DIAGNOSIS — L603 Nail dystrophy: Secondary | ICD-10-CM | POA: Diagnosis not present

## 2023-06-01 NOTE — Progress Notes (Signed)
  Subjective:  Patient ID: Cristina Mcdonald, female    DOB: 04/06/1960,  MRN: [id number] HPI Chief Complaint  Patient presents with   Nail Problem    Hallux bilateral - bumped toenails about 6 months ago, now has started to [facility], concern for fungus, PCP said didn't appear to be fungus and told her to just let the nail grow out   New Patient (Initial Visit)    63 y.o. female presents with the above complaint.   ROS: Denies fever chills nausea vomiting muscle aches pains calf pain back pain chest pain shortness of breath  Past Medical History:  Diagnosis Date   Anxiety    Depression    DVT (deep venous thrombosis) (HCC)    Fibromyalgia    Past Surgical History:  Procedure Laterality Date   KIDNEY STONE SURGERY     MYOMECTOMY     PARTIAL HYSTERECTOMY      Current Outpatient Medications:    lidocaine (LIDODERM) 5 %, Apply topically., Disp: , Rfl:    clonazePAM (KLONOPIN) 0.5 MG tablet, Take 0.25 mg by mouth at bedtime., Disp: , Rfl:    cyclobenzaprine (FLEXERIL) 10 MG tablet, Take 1 tablet by mouth 3 (three) times daily as needed., Disp: , Rfl:    gabapentin (NEURONTIN) 600 MG tablet, TAKE 1 TABLET BY MOUTH IN THE MORNING AND 1 IN THE AFTERNOON AND 2 EVERY DAY AT BEDTIME, Disp: , Rfl:    Melatonin 2.5 MG CHEW, Chew 2.5 mg by mouth as needed., Disp: , Rfl:    meloxicam (MOBIC) 15 MG tablet, Take 15 mg by mouth daily as needed., Disp: , Rfl:    traZODone (DESYREL) 150 MG tablet, , Disp: , Rfl:   Allergies  Allergen Reactions   Iodine    Shellfish Allergy    Review of Systems Objective:  There were no vitals filed for this visit.  General: Well developed, nourished, in no acute distress, alert and oriented x3   Dermatological: Skin is warm, dry and supple bilateral. Nails x 10 are well maintained; remaining integument appears unremarkable at this time. There are no open sores, no preulcerative lesions, no rash or signs of infection present.  Nail plate hallux bilateral does  demonstrate what appears to be nail dystrophy with some subungual debris cannot completely rule out onychomycosis.  Does not demonstrate any type of rash around the skin that would be indicative of tinea pedis.  Vascular: Dorsalis Pedis artery and Posterior Tibial artery pedal pulses are 2/4 bilateral with immedate capillary fill time. Pedal hair growth present. No varicosities and no lower extremity edema present bilateral.   Neruologic: Grossly intact via light touch bilateral. Vibratory intact via tuning fork bilateral. Protective threshold with Semmes Wienstein monofilament intact to all pedal sites bilateral. Patellar and Achilles deep tendon reflexes 2+ bilateral. No Babinski or clonus noted bilateral.   Musculoskeletal: No gross boney pedal deformities bilateral. No pain, crepitus, or limitation noted with foot and ankle range of motion bilateral. Muscular strength 5/5 in all groups tested bilateral.  Gait: Unassisted, Nonantalgic.    Radiographs:  None taken  Assessment & Plan:   Assessment: Nail dystrophy cannot rule out onychomycosis hallux bilateral  Plan: Samples of the skin and nail were taken today to be sent for pathologic evaluation I will follow-up with her in 1 month     Juvon Teater T. [place], [place]

## 2023-06-20 ENCOUNTER — Other Ambulatory Visit: Payer: Self-pay | Admitting: Podiatry

## 2023-06-28 ENCOUNTER — Telehealth: Payer: Self-pay | Admitting: Podiatry

## 2023-06-28 NOTE — Telephone Encounter (Signed)
 Pt has a F/U for tomorrow w/ Al Corpus for: for BAKO Results. She is unable to keep that appt due to transportation and wanted to resch for next week. [facility] is on vac.  Can these results be looked at to determine is she needs to keep this appt? She tried to read it and said no fungus trauma to the toe, but still unclear if there is something else Dr Al Corpus needs to see her for.   You can respond to her via MyChart (or I can call her w/ a message) If she still needs to be send, can she be seen by another doctor next week and if so, who do you want me to sch her with?

## 2023-06-29 ENCOUNTER — Ambulatory Visit: Admitting: Podiatry

## 2023-07-13 ENCOUNTER — Emergency Department (HOSPITAL_COMMUNITY)
Admission: EM | Admit: 2023-07-13 | Discharge: 2023-07-13 | Disposition: A | Discharge status: Home / Self Care | Attending: Emergency Medicine | Admitting: Emergency Medicine

## 2023-07-13 ENCOUNTER — Encounter (HOSPITAL_COMMUNITY): Payer: Self-pay | Admitting: Emergency Medicine

## 2023-07-13 ENCOUNTER — Other Ambulatory Visit: Payer: Self-pay

## 2023-07-13 DIAGNOSIS — M5417 Radiculopathy, lumbosacral region: Secondary | ICD-10-CM | POA: Diagnosis not present

## 2023-07-13 DIAGNOSIS — M79604 Pain in right leg: Secondary | ICD-10-CM | POA: Diagnosis present

## 2023-07-13 MED ORDER — DICLOFENAC EPOLAMINE 1.3 % EX PTCH
1.0000 | MEDICATED_PATCH | Freq: Two times a day (BID) | CUTANEOUS | 1 refills | Status: AC
Start: 2023-07-13 — End: ?

## 2023-07-13 MED ORDER — HYDROCODONE-ACETAMINOPHEN 5-325 MG PO TABS: 1.0000 | ORAL_TABLET | Freq: Four times a day (QID) | ORAL | 0 refills | Status: AC | PRN

## 2023-07-13 MED ORDER — PREDNISONE 20 MG PO TABS: 40.0000 mg | ORAL_TABLET | Freq: Every day | ORAL | 0 refills | Status: AC

## 2023-07-13 MED ORDER — METHYLPREDNISOLONE SODIUM SUCC 125 MG IJ SOLR
125.0000 mg | Freq: Once | INTRAMUSCULAR | Status: DC
  Filled 2023-07-13: qty 2

## 2023-07-13 MED ORDER — KETOROLAC TROMETHAMINE 15 MG/ML IJ SOLN
15.0000 mg | Freq: Once | INTRAMUSCULAR | Status: AC
  Administered 2023-07-13: 15 mg via INTRAVENOUS
  Filled 2023-07-13: qty 1

## 2023-07-13 MED ORDER — DICLOFENAC EPOLAMINE 1.3 % EX PTCH
1.0000 | MEDICATED_PATCH | Freq: Two times a day (BID) | CUTANEOUS | Status: DC
  Filled 2023-07-13 (×2): qty 1

## 2023-07-13 NOTE — ED Provider Notes (Signed)
 [place] EMERGENCY DEPARTMENT AT [facility] Provider Note   CSN: [id number] Arrival date & time: 07/13/23  1326     History  Chief Complaint  Patient presents with   Leg Pain    Cristina Mcdonald is a 63 y.o. female.  HPI Patient with known spinal disease, cervical and lumbar presents with right leg pain.  She describes pain as radiating from the right buttocks to her right leg, intermittently anteriorly, lateral, posterior.  No abdominal pain, no incontinence.  Symptoms are worse after walking for some time.  She has been take muscle relaxants, gabapentin without substantial relief.  She has been seen, evaluated by neurosurgery for cervical and lumbar disease, anticipates follow-up with one of her prior evaluators.    Home Medications Prior to Admission medications   Medication Sig Start Date End Date Taking? Authorizing Provider  diclofenac  (FLECTOR ) 1.3 % PT[facility] Place 1 patch onto the skin 2 (two) times daily. 07/13/23  Yes Dorenda Gandy, MD  HYDROcodone -acetaminophen  (NORCO/VICODIN) 5-325 MG tablet Take 1 tablet by mouth every 6 (six) hours as needed. 07/13/23  Yes Dorenda Gandy, MD  predniSONE  (DELTASONE ) 20 MG tablet Take 2 tablets (40 mg total) by mouth daily with breakfast. For the next four days 07/13/23  Yes Dorenda Gandy, MD  clonazePAM (KLONOPIN) 0.5 MG tablet Take 0.25 mg by mouth at bedtime. 03/06/23   [provider]  cyclobenzaprine (FLEXERIL) 10 MG tablet Take 1 tablet by mouth 3 (three) times daily as needed. 02/28/23   [provider]  gabapentin (NEURONTIN) 600 MG tablet TAKE 1 TABLET BY MOUTH IN THE MORNING AND 1 IN THE AFTERNOON AND 2 EVERY DAY AT BEDTIME 05/16/19   [provider]  lidocaine (LIDODERM) 5 % Apply topically. 05/23/23   [provider]  Melatonin 2.5 MG CHEW Chew 2.5 mg by mouth as needed.    [provider]  meloxicam (MOBIC) 15 MG tablet Take 15 mg by mouth daily as needed.    [provider]  traZODone (DESYREL) 150 MG tablet     [provider]      Allergies    Iodine and Shellfish allergy    Review of Systems   Review of Systems  Physical Exam Updated Vital Signs BP 121/86 (BP Location: Left Arm)   Pulse 100   Temp 98.2 F (36.8 C) (Oral)   Resp 17   Ht 5\' 4"  (1.626 m)   Wt 62.1 kg   SpO2 98%   BMI 23.52 kg/m  Physical Exam Vitals and nursing note reviewed.  Constitutional:      General: She is not in acute distress.    Appearance: She is well-developed.  HENT:     Head: Normocephalic and atraumatic.  Eyes:     Conjunctiva/sclera: Conjunctivae normal.  Pulmonary:     Effort: Pulmonary effort is normal. No respiratory distress.     Breath sounds: No stridor.  Abdominal:     General: There is no distension.  Musculoskeletal:        General: No deformity.  Skin:    General: Skin is warm and dry.  Neurological:     Mental Status: She is alert and oriented to person, place, and time.     Cranial Nerves: No cranial nerve deficit.     Comments: 5/5 strength proximal, distal both lower extremities symmetrically without substantial referred pain.  Psychiatric:        Mood and Affect: Mood normal.     ED Results /  Procedures / Treatments   Labs (all labs ordered are listed, but only abnormal results are displayed) Labs Reviewed - No data to display  EKG None  Radiology No results found.  Procedures Procedures    Medications Ordered in ED Medications  methylPREDNISolone  sodium succinate (SOLU-MEDROL ) 125 mg/2 mL injection 125 mg (has no administration in time range)  ketorolac  (TORADOL ) 15 MG/ML injection 15 mg (has no administration in time range)  diclofenac  (FLECTOR ) 1.3 % 1 patch (has no administration in time range)    ED Course/ Medical Decision Making/ A&P                                 Medical Decision Making Adult female presents with ongoing right leg pain radiating from the buttocks to the leg.   Patient has no red flags suggesting new CNS pathology.  I reviewed the patient's MRI from within the past 6 months, consistent with cervical and lumbosacral disease.  Given the duration of her symptoms, low suspicion for cauda equina, patient started appropriate anti-inflammatories, will follow-up with one of our neurosurgical colleagues.   Amount and/or Complexity of Data Reviewed External Data Reviewed: notes.    Details: MRI reviewed, prior notes reviewed Radiology: independent interpretation performed.  Risk Prescription drug management.  Final Clinical Impression(s) / ED Diagnoses Final diagnoses:  Lumbosacral radiculopathy    Rx / DC Orders ED Discharge Orders          Ordered    predniSONE  (DELTASONE ) 20 MG tablet  Daily with breakfast        07/13/23 1447    HYDROcodone -acetaminophen  (NORCO/VICODIN) 5-325 MG tablet  Every 6 hours PRN        07/13/23 1447    diclofenac  (FLECTOR ) 1.3 % PT[facility]  2 times daily        07/13/23 1447              Dorenda Gandy, MD 07/13/23 1449

## 2023-07-13 NOTE — Discharge Instructions (Signed)
 Please be sure to follow-up with your surgical team.  Take medication as directed and do not hesitate to return here for concerning changes in your condition.

## 2023-07-13 NOTE — ED Triage Notes (Signed)
 Patient reports with bilateral throbbing leg pain. Pain keeps her from sleeping. She has a history of sciatica, but feels this is a little different. Movement helps the pain.

## 2023-09-02 ENCOUNTER — Other Ambulatory Visit: Payer: Self-pay

## 2023-09-02 ENCOUNTER — Encounter (HOSPITAL_COMMUNITY): Payer: Self-pay

## 2023-09-02 ENCOUNTER — Emergency Department (HOSPITAL_COMMUNITY)
Admission: EM | Admit: 2023-09-02 | Discharge: 2023-09-02 | Disposition: A | Discharge status: Home / Self Care | Payer: Self-pay | Attending: Emergency Medicine | Admitting: Emergency Medicine

## 2023-09-02 DIAGNOSIS — R42 Dizziness and giddiness: Secondary | ICD-10-CM | POA: Insufficient documentation

## 2023-09-02 DIAGNOSIS — H6122 Impacted cerumen, left ear: Secondary | ICD-10-CM | POA: Diagnosis not present

## 2023-09-02 LAB — COMPREHENSIVE METABOLIC PANEL WITH GFR
ALT: 22 U/L (ref 0–44)
AST: 19 U/L (ref 15–41)
Albumin: 4.3 g/dL (ref 3.5–5.0)
Alkaline Phosphatase: 64 U/L (ref 38–126)
Anion gap: 9 (ref 5–15)
BUN: 22 mg/dL (ref 8–23)
CO2: 24 mmol/L (ref 22–32)
Calcium: 9.8 mg/dL (ref 8.9–10.3)
Chloride: 105 mmol/L (ref 98–111)
Creatinine, Ser: 0.75 mg/dL (ref 0.44–1.00)
GFR, Estimated: >60 mL/min (ref >60)
Glucose, Bld: 92 mg/dL (ref 70–99)
Potassium: 3.9 mmol/L (ref 3.5–5.1)
Sodium: 138 mmol/L (ref 135–145)
Total Bilirubin: 0.7 mg/dL (ref 0.0–1.2)
Total Protein: 7.2 g/dL (ref 6.5–8.1)

## 2023-09-02 LAB — URINALYSIS, ROUTINE W REFLEX MICROSCOPIC
Bilirubin Urine: NEGATIVE
Glucose, UA: NEGATIVE mg/dL
Hgb urine dipstick: NEGATIVE
Ketones, ur: NEGATIVE mg/dL
Leukocytes,Ua: NEGATIVE
Nitrite: NEGATIVE
Protein, ur: NEGATIVE mg/dL
Specific Gravity, Urine: 1.002 — ABNORMAL LOW (ref 1.005–1.030)
pH: 6 (ref 5.0–8.0)

## 2023-09-02 LAB — CBC
HCT: 49.8 % — ABNORMAL HIGH (ref 36.0–46.0)
Hemoglobin: 14.9 g/dL (ref 12.0–15.0)
MCH: 27.5 pg (ref 26.0–34.0)
MCHC: 29.9 g/dL — ABNORMAL LOW (ref 30.0–36.0)
MCV: 91.9 fL (ref 80.0–100.0)
Platelets: 167 10*3/uL (ref 150–400)
RBC: 5.42 MIL/uL — ABNORMAL HIGH (ref 3.87–5.11)
RDW: 13.9 % (ref 11.5–15.5)
WBC: 6.3 10*3/uL (ref 4.0–10.5)
nRBC: 0 % (ref 0.0–0.2)

## 2023-09-02 MED ORDER — MECLIZINE HCL 25 MG PO TABS: 25.0000 mg | ORAL_TABLET | Freq: Three times a day (TID) | ORAL | 0 refills | Status: AC | PRN

## 2023-09-02 MED ORDER — MECLIZINE HCL 25 MG PO TABS: 25.0000 mg | ORAL_TABLET | Freq: Three times a day (TID) | ORAL | 0 refills | Status: DC | PRN

## 2023-09-02 MED ORDER — MECLIZINE HCL 25 MG PO TABS
25.0000 mg | ORAL_TABLET | Freq: Once | ORAL | Status: AC
  Administered 2023-09-02: 25 mg via ORAL
  Filled 2023-09-02: qty 1

## 2023-09-02 NOTE — ED Provider Notes (Signed)
 [place] EMERGENCY DEPARTMENT AT [facility] Provider Note   CSN: [id number] Arrival date & time: 09/02/23  1456     Patient presents with: Dizziness and Palpitations   Cristina Mcdonald is a 63 y.o. female.   63 year old female with history of vertigo and multiple left ear surgeries presents with dizziness which began this morning.  States that it gets worse when she leans her head forward.  Denies any ear pain.  No severe headache.  Has had some palpitations but denies any chest pain or syncope.  No recent medication changes.  No treatment use prior to arrival       Prior to Admission medications   Medication Sig Start Date End Date Taking? Authorizing Provider  clonazePAM (KLONOPIN) 0.5 MG tablet Take 0.25 mg by mouth at bedtime. 03/06/23   [provider]  cyclobenzaprine (FLEXERIL) 10 MG tablet Take 1 tablet by mouth 3 (three) times daily as needed. 02/28/23   [provider]  diclofenac  (FLECTOR ) 1.3 % PTCH Place 1 patch onto the skin 2 (two) times daily. 07/13/23   Dorenda Gandy, MD  gabapentin (NEURONTIN) 600 MG tablet TAKE 1 TABLET BY MOUTH IN THE MORNING AND 1 IN THE AFTERNOON AND 2 EVERY DAY AT BEDTIME 05/16/19   [provider]  HYDROcodone -acetaminophen  (NORCO/VICODIN) 5-325 MG tablet Take 1 tablet by mouth every 6 (six) hours as needed. 07/13/23   Dorenda Gandy, MD  lidocaine (LIDODERM) 5 % Apply topically. 05/23/23   [provider]  Melatonin 2.5 MG CHEW Chew 2.5 mg by mouth as needed.    [provider]  meloxicam (MOBIC) 15 MG tablet Take 15 mg by mouth daily as needed.    [provider]  predniSONE  (DELTASONE ) 20 MG tablet Take 2 tablets (40 mg total) by mouth daily with breakfast. For the next four days 07/13/23   Dorenda Gandy, MD  traZODone (DESYREL) 150 MG tablet     [provider]    Allergies: Iodine and Shellfish allergy    Review of Systems  All other systems reviewed and are  negative.   Updated Vital Signs BP (!) 144/93   Pulse 76   Temp 98.5 F (36.9 C) (Oral)   Resp 17   Ht 1.626 m (5' 4)   Wt 62.6 kg   SpO2 99%   BMI 23.69 kg/m   Physical Exam Vitals and nursing note reviewed.  Constitutional:      General: She is not in acute distress.    Appearance: Normal appearance. She is well-developed. She is not toxic-appearing.  HENT:     Head: Normocephalic and atraumatic.     Left Ear: There is impacted cerumen.   Eyes:     General: Lids are normal.     Conjunctiva/sclera: Conjunctivae normal.     Pupils: Pupils are equal, round, and reactive to light.   Neck:     Thyroid: No thyroid mass.     Trachea: No tracheal deviation.   Cardiovascular:     Rate and Rhythm: Normal rate and regular rhythm.     Heart sounds: Normal heart sounds. No murmur heard.    No gallop.  Pulmonary:     Effort: Pulmonary effort is normal. No respiratory distress.     Breath sounds: Normal breath sounds. No stridor. No decreased breath sounds, wheezing, rhonchi or rales.  Abdominal:     General: There is no distension.     Palpations: Abdomen is soft.     Tenderness: There  is no abdominal tenderness. There is no rebound.   Musculoskeletal:        General: No tenderness. Normal range of motion.     Cervical back: Normal range of motion and neck supple.   Skin:    General: Skin is warm and dry.     Findings: No abrasion or rash.   Neurological:     General: No focal deficit present.     Mental Status: She is alert and oriented to person, place, and time. Mental status is at baseline.     GCS: GCS eye subscore is 4. GCS verbal subscore is 5. GCS motor subscore is 6.     Cranial Nerves: No cranial nerve deficit.     Sensory: No sensory deficit.     Motor: Motor function is intact.   Psychiatric:        Attention and Perception: Attention normal.        Speech: Speech normal.        Behavior: Behavior normal.     (all labs ordered are listed, but only  abnormal results are displayed) Labs Reviewed  COMPREHENSIVE METABOLIC PANEL WITH GFR  CBC  URINALYSIS, ROUTINE W REFLEX MICROSCOPIC    EKG: EKG Interpretation Date/Time:  Saturday September 02 2023 15:06:21 EDT Ventricular Rate:  86 PR Interval:  192 QRS Duration:  114 QT Interval:  360 QTC Calculation: 431 R Axis:   -35  Text Interpretation: Sinus rhythm Probable left atrial enlargement Incomplete RBBB and LAFB Consider anterior infarct Confirmed by Lind Repine ([id number]) on 09/02/2023 4:01:27 PM  Radiology: No results found.   Procedures   Medications Ordered in the ED - No data to display                                  Medical Decision Making Amount and/or Complexity of Data Reviewed Labs: ordered.   Patient's EKG shows normal sinus rhythm.  Concern that patient has vertigo given Antivert and feels much better.  Feel that this is referral not central.  Does have some cerumen in her left ear canal.  Due to patient's prior history of extensive surgery we will referral to ENT for further evaluation.  Will prescribe Antivert and discharged home     Final diagnoses:  None    ED Discharge Orders     None          Lind Repine, MD 09/02/23 1756

## 2023-09-02 NOTE — ED Notes (Signed)
 Red top sent to the lab

## 2023-09-02 NOTE — ED Triage Notes (Addendum)
 Pt to ED via POV c/o dizziness that started this morning around 9am when woke up. Reports getting better, but still positional dizziness, reports hx of vertigo and multiple ear surgeries . A&O X 4 in triage. Reports palpitations x 1 week, reports increase stress at home. Denies CP

## 2023-12-28 ENCOUNTER — Emergency Department (HOSPITAL_COMMUNITY)

## 2023-12-28 ENCOUNTER — Emergency Department (HOSPITAL_COMMUNITY)
Admission: EM | Admit: 2023-12-28 | Discharge: 2023-12-28 | Disposition: A | Discharge status: Home / Self Care | Attending: Emergency Medicine | Admitting: Emergency Medicine

## 2023-12-28 ENCOUNTER — Encounter (HOSPITAL_COMMUNITY): Payer: Self-pay | Admitting: Emergency Medicine

## 2023-12-28 ENCOUNTER — Other Ambulatory Visit: Payer: Self-pay

## 2023-12-28 DIAGNOSIS — W19XXXA Unspecified fall, initial encounter: Secondary | ICD-10-CM

## 2023-12-28 DIAGNOSIS — R519 Headache, unspecified: Secondary | ICD-10-CM | POA: Diagnosis present

## 2023-12-28 DIAGNOSIS — S0990XA Unspecified injury of head, initial encounter: Secondary | ICD-10-CM | POA: Diagnosis not present

## 2023-12-28 DIAGNOSIS — M542 Cervicalgia: Secondary | ICD-10-CM | POA: Insufficient documentation

## 2023-12-28 DIAGNOSIS — W0110XA Fall on same level from slipping, tripping and stumbling with subsequent striking against unspecified object, initial encounter: Secondary | ICD-10-CM | POA: Diagnosis not present

## 2023-12-28 DIAGNOSIS — M7918 Myalgia, other site: Secondary | ICD-10-CM

## 2023-12-28 LAB — CBC
HCT: 48.9 % — ABNORMAL HIGH (ref 36.0–46.0)
Hemoglobin: 14.9 g/dL (ref 12.0–15.0)
MCH: 27.4 pg (ref 26.0–34.0)
MCHC: 30.5 g/dL (ref 30.0–36.0)
MCV: 89.9 fL (ref 80.0–100.0)
Platelets: 191 K/uL (ref 150–400)
RBC: 5.44 MIL/uL — ABNORMAL HIGH (ref 3.87–5.11)
RDW: 13.9 % (ref 11.5–15.5)
WBC: 7.6 K/uL (ref 4.0–10.5)
nRBC: 0 % (ref 0.0–0.2)

## 2023-12-28 MED ORDER — LIDOCAINE 5 % EX PTCH: 1.0000 | MEDICATED_PATCH | CUTANEOUS | 0 refills | Status: AC

## 2023-12-28 MED ORDER — LIDOCAINE 5 % EX PTCH: 1.0000 | MEDICATED_PATCH | CUTANEOUS | 0 refills | Status: DC

## 2023-12-28 MED ORDER — LIDOCAINE 5 % EX PTCH
2.0000 | MEDICATED_PATCH | CUTANEOUS | Status: DC
  Administered 2023-12-28: 2 via TRANSDERMAL
  Filled 2023-12-28: qty 2

## 2023-12-28 NOTE — ED Triage Notes (Signed)
 Patient c/o fall yesterday. Patient report she tripped and fell face down in the floor. Patient report worsening headache , neck pain and back pain today. Patient report nausea denies vomiting. Patient denies LOC. Patient denies blood thinners.

## 2023-12-28 NOTE — Discharge Instructions (Addendum)
 Today you were seen after a fall.  I suspect you likely have a minor head injury and musculoskeletal pain. Your workup while in the emergency department was reassuring.  Please pick up your Lidoderm patches and use as prescribed.  You may also alternate Tylenol  and Motrin as needed for pain.  Please return to the ED if you have uncontrollable vomiting, double vision, or numbness/weakness.  Thank you for letting us  treat you today. After reviewing your labs and imaging, I feel you are safe to go home. Please follow up with your PCP in the next several days and provide them with your records from this visit. Return to the Emergency Room if pain becomes severe or symptoms worsen.

## 2023-12-28 NOTE — ED Provider Notes (Signed)
 [place] EMERGENCY DEPARTMENT AT [facility] Provider Note   CSN: [id number] Arrival date & time: 12/28/23  1555     Patient presents with: Cristina Mcdonald is a 63 y.o. female.  Presents today after mechanical fall yesterday in which she landed face down on the wooden floor.  Patient reports headache and neck pain that is worsened with movement.  Patient also reporting nausea without vomiting.  Patient denies LOC, neck stiffness, diplopia, tinnitus, loss of bowel or bladder control, saddle anesthesia, or any other injuries.  Patient denies blood thinner use.  Patient has been able to ambulate without issue after injury.    Fall Associated symptoms include headaches.       Prior to Admission medications   Medication Sig Start Date End Date Taking? Authorizing Provider  lidocaine (LIDODERM) 5 % Place 1 patch onto the skin daily. Remove & Discard patch within 12 hours or as directed by MD 12/28/23  Yes Kai Calico N, PA-C  clonazePAM (KLONOPIN) 0.5 MG tablet Take 0.25 mg by mouth at bedtime. 03/06/23   [provider]  cyclobenzaprine (FLEXERIL) 10 MG tablet Take 1 tablet by mouth 3 (three) times daily as needed. 02/28/23   [provider]  diclofenac  (FLECTOR ) 1.3 % PTCH Place 1 patch onto the skin 2 (two) times daily. 07/13/23   Garrick Charleston, MD  gabapentin (NEURONTIN) 600 MG tablet TAKE 1 TABLET BY MOUTH IN THE MORNING AND 1 IN THE AFTERNOON AND 2 EVERY DAY AT BEDTIME 05/16/19   [provider]  HYDROcodone -acetaminophen  (NORCO/VICODIN) 5-325 MG tablet Take 1 tablet by mouth every 6 (six) hours as needed. 07/13/23   Garrick Charleston, MD  lidocaine (LIDODERM) 5 % Apply topically. 05/23/23   [provider]  meclizine  (ANTIVERT ) 25 MG tablet Take 1 tablet (25 mg total) by mouth 3 (three) times daily as needed for dizziness. 09/02/23   Dasie Faden, MD  Melatonin 2.5 MG CHEW Chew 2.5 mg by mouth as needed.    [provider]   meloxicam (MOBIC) 15 MG tablet Take 15 mg by mouth daily as needed.    [provider]  predniSONE  (DELTASONE ) 20 MG tablet Take 2 tablets (40 mg total) by mouth daily with breakfast. For the next four days 07/13/23   Garrick Charleston, MD  traZODone (DESYREL) 150 MG tablet     [provider]    Allergies: Iodine and Shellfish allergy    Review of Systems  Musculoskeletal:  Positive for neck pain.  Neurological:  Positive for headaches.    Updated Vital Signs BP (!) 140/110   Pulse 83   Temp 98 F (36.7 C)   Resp 16   SpO2 100%   Physical Exam Vitals and nursing note reviewed.  Constitutional:      General: She is not in acute distress.    Appearance: Normal appearance. She is well-developed. She is not ill-appearing or toxic-appearing.  HENT:     Head: Normocephalic and atraumatic.     Comments: Patient's face is atraumatic without any signs of injury, deformity, or ecchymosis.    Right Ear: External ear normal.     Left Ear: External ear normal.  Eyes:     Extraocular Movements: Extraocular movements intact.     Conjunctiva/sclera: Conjunctivae normal.     Pupils: Pupils are equal, round, and reactive to light.  Neck:     Comments: Patient has bony tenderness to palpation of the C-spine and paraspinal muscles in the C-spine  region without obvious deformity, step-off, or ecchymosis. Cardiovascular:     Rate and Rhythm: Normal rate and regular rhythm.     Pulses: Normal pulses.     Heart sounds: Normal heart sounds. No murmur heard. Pulmonary:     Effort: Pulmonary effort is normal. No respiratory distress.     Breath sounds: Normal breath sounds.  Abdominal:     Palpations: Abdomen is soft.     Tenderness: There is no abdominal tenderness.  Musculoskeletal:        General: No swelling.     Cervical back: Normal range of motion and neck supple.     Comments: Patient denies tenderness to palpation of the thoracic, lumbar, or sacral spine.  Skin:     General: Skin is warm and dry.     Capillary Refill: Capillary refill takes less than 2 seconds.  Neurological:     General: No focal deficit present.     Mental Status: She is alert.  Psychiatric:        Mood and Affect: Mood normal.     (all labs ordered are listed, but only abnormal results are displayed) Labs Reviewed  CBC - Abnormal; Notable for the following components:      Result Value   RBC 5.44 (*)    HCT 48.9 (*)    All other components within normal limits    EKG: EKG Interpretation Date/Time:  Thursday December 28 2023 16:12:50 EDT Ventricular Rate:  75 PR Interval:  172 QRS Duration:  97 QT Interval:  364 QTC Calculation: 407 R Axis:   -49  Text Interpretation: Sinus rhythm Probable left atrial enlargement RSR' in V1 or V2, right VCD or RVH Left ventricular hypertrophy Inferior infarct, old Anterior Q waves, possibly due to LVH Confirmed by Mannie Pac [phone]) on 12/28/2023 5:59:09 PM  Radiology: CT HEAD WO CONTRAST Result Date: 12/28/2023 CLINICAL DATA:  Trip and fall yesterday EXAM: CT HEAD WITHOUT CONTRAST CT CERVICAL SPINE WITHOUT CONTRAST TECHNIQUE: Multidetector CT imaging of the head and cervical spine was performed following the standard protocol without intravenous contrast. Multiplanar CT image reconstructions of the cervical spine were also generated. RADIATION DOSE REDUCTION: This exam was performed according to the departmental dose-optimization program which includes automated exposure control, adjustment of the mA and/or kV according to patient size and/or use of iterative reconstruction technique. COMPARISON:  None Available. FINDINGS: CT HEAD FINDINGS Brain: No evidence of acute infarction, hemorrhage, hydrocephalus, extra-axial collection or mass lesion/mass effect. Vascular: No hyperdense vessel or unexpected calcification. Skull: Normal. Negative for fracture or focal lesion. Sinuses/Orbits: No acute finding.  Left mastoidectomy. Other: None. CT  CERVICAL SPINE FINDINGS Alignment: Degenerative straightening and reversal of the normal cervical lordosis Skull base and vertebrae: No acute fracture. No primary bone lesion or focal pathologic process. Soft tissues and spinal canal: No prevertebral fluid or swelling. No visible canal hematoma. Disc levels:  Moderate disc degenerative change from C4-C7 Upper chest: Negative. Other: None. IMPRESSION: 1. No acute intracranial pathology. 2. No fracture or static subluxation of the cervical spine. 3. Moderate disc degenerative change from C4-C7. Electronically Signed   By: Marolyn JONETTA Jaksch M.D.   On: 12/28/2023 17:48   CT Cervical Spine Wo Contrast Result Date: 12/28/2023 CLINICAL DATA:  Trip and fall yesterday EXAM: CT HEAD WITHOUT CONTRAST CT CERVICAL SPINE WITHOUT CONTRAST TECHNIQUE: Multidetector CT imaging of the head and cervical spine was performed following the standard protocol without intravenous contrast. Multiplanar CT image reconstructions of the cervical spine were also  generated. RADIATION DOSE REDUCTION: This exam was performed according to the departmental dose-optimization program which includes automated exposure control, adjustment of the mA and/or kV according to patient size and/or use of iterative reconstruction technique. COMPARISON:  None Available. FINDINGS: CT HEAD FINDINGS Brain: No evidence of acute infarction, hemorrhage, hydrocephalus, extra-axial collection or mass lesion/mass effect. Vascular: No hyperdense vessel or unexpected calcification. Skull: Normal. Negative for fracture or focal lesion. Sinuses/Orbits: No acute finding.  Left mastoidectomy. Other: None. CT CERVICAL SPINE FINDINGS Alignment: Degenerative straightening and reversal of the normal cervical lordosis Skull base and vertebrae: No acute fracture. No primary bone lesion or focal pathologic process. Soft tissues and spinal canal: No prevertebral fluid or swelling. No visible canal hematoma. Disc levels:  Moderate disc  degenerative change from C4-C7 Upper chest: Negative. Other: None. IMPRESSION: 1. No acute intracranial pathology. 2. No fracture or static subluxation of the cervical spine. 3. Moderate disc degenerative change from C4-C7. Electronically Signed   By: Marolyn JONETTA Jaksch M.D.   On: 12/28/2023 17:48     Procedures   Medications Ordered in the ED  lidocaine (LIDODERM) 5 % 2 patch (has no administration in time range)                                    Medical Decision Making Amount and/or Complexity of Data Reviewed Labs: ordered. Radiology: ordered.   This patient presents to the ED for concern of fall, this involves an extensive number of treatment options, and is a complaint that carries with it a high risk of complications and morbidity.  The differential diagnosis includes brain bleed, minor head injury, musculoskeletal pain, C-spine injury, vertebral fracture   Lab Tests:  I Ordered, and personally interpreted labs.  The pertinent results include: CBC unremarkable   Imaging Studies ordered:  I ordered imaging studies including CT head and C-spine Noncon I independently visualized and interpreted imaging which showed no acute intracranial pathology.  No fracture or static subluxation of the C-spine. I agree with the radiologist interpretation   Cardiac Monitoring: / EKG:  The patient was maintained on a cardiac monitor.  I personally viewed and interpreted the cardiac monitored which showed an underlying rhythm of: Sinus rhythm with probable LAE   Problem List / ED Course / Critical interventions / Medication management  I ordered medication including Lidoderm patch I have reviewed the patients home medicines and have made adjustments as needed   Test / Admission - Considered:  Considered for admission or further workup however patient's vital signs, physical exam, labs, and imaging are reassuring.  Patient's symptoms likely due to minor head injury and musculoskeletal  pain.  Patient given Lidoderm patches outpatient and advised to alternate Tylenol  Motrin as needed for pain.  Patient given return precautions.  I feel patient is safe for discharge at this time.     Final diagnoses:  Fall, initial encounter  Musculoskeletal pain  Minor head injury, initial encounter    ED Discharge Orders          Ordered    lidocaine (LIDODERM) 5 %  Every 24 hours        12/28/23 1802               Francis Ileana SAILOR, PA-C 12/28/23 1802    Mannie Pac T, DO 01/02/24 [phone]
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
## 2024-02-22 NOTE — Progress Notes (Signed)
 Urine culture negative. Would recommend discontinuation of antibiotics, but patient can discuss with Family Medicine as they prescribed her current Macrobid.  Follow recommendations provided during visit. Follow-up with PCP as directed.

## 2024-03-01 NOTE — Telephone Encounter (Signed)
 Copied from CRM #[id number]. Topic: Information Request - General Information Request/Update >> Mar 01, 2024  4:47 PM Tia B wrote: Gisella, Alwine is calling other request    Include all details related to the request(s) below: Patient is asking that her pcp respond to her portal messages that she sent on 12/09.     Confirm and type the Best Contact Number below:  Patient/caller contact number:  [phone]           [] Home  [x] Mobile  [] Work [] Other   [x] Okay to leave a voicemail   Medication List:  Current Outpatient Medications:    bisoprolol (ZEBETA) 5 mg tablet, Take 0.5 tablets (2.5 mg total) by mouth daily., Disp: 45 tablet, Rfl: 3   clonazePAM (KlonoPIN) 0.5 mg tablet, TAKE 1 TABLET BY MOUTH AT BEDTIME, Disp: 30 tablet, Rfl: 2   cyclobenzaprine (FLEXERIL) 10 mg tablet, Take 1 tablet by mouth three times daily as needed for muscle spasm, Disp: 30 tablet, Rfl: 2   DULoxetine (CYMBALTA) 20 mg capsule, Take 1 capsule (20 mg total) by mouth daily. Used daily for fibromyalgia, Disp: 30 capsule, Rfl: 5   estradioL (ESTRACE) 0.01 % (0.1 mg/gram) vaginal cream, Insert 0.5 grams vaginally twice weekly., Disp: 42.5 g, Rfl: 3   fluconazole (DIFLUCAN) 150 mg tablet, Take one tablet if symptoms of yeast infection begin.  Take another after three days if still having symptoms., Disp: 2 tablet, Rfl: 0   gabapentin (NEURONTIN) 600 mg tablet, TAKE 1 TABLET BY MOUTH IN THE MORNING AND 1 TABLET IN THE AFTERNOON AND 2 TABLETS AT BEDTIME, Disp: 120 tablet, Rfl: 5   lidocaine  (LIDODERM ) 5 % patch, Apply 1 patch topically daily., Disp: 30 patch, Rfl: 2   melatonin 2.5 mg chew, Chew 2.5 mg as needed., Disp: , Rfl:    meloxicam (MOBIC) 15 mg tablet, TAKE 1 TABLET BY MOUTH ONCE DAILY AS NEEDED FOR PAIN, Disp: 30 tablet, Rfl: 0   traZODone (DESYREL) 50 mg tablet, Take 2 tablets (100 mg total) by mouth nightly as needed for sleep. FOR INSOMNIA, Disp: 60 tablet, Rfl: 3   ubrogepant (Ubrelvy) 100  mg tab tablet, 1 tablet PO daily as needed for migraine headache, Disp: 10 tablet, Rfl: 5     Medication Request/Refills: Pharmacy Information (if applicable)   [] Not Applicable       []  Pharmacy listed  Send Medication Request to:                                                 [] Pharmacy not listed (added to pharmacy list in Epic) Send Medication Request to:      Listed Pharmacies: [facility] [address], [place] - [address] - PHONE: [phone] - FAX: [phone]

## 2024-03-04 NOTE — Telephone Encounter (Signed)
 Good afternoon, patient calling in about the Portal correspondence. States that she called the hospital to set up the urogram but they advised her they are needing first the order from PCP faxed to them in order to go forward and to schedule, patient is asking if we are able to fax this order to this location and could possibly assist with getting an appointment scheduled. The patient is wanting to try and complete this before insurance change if possible. Live Answer unable to addend Portal messages here is the requested location information:  Hi Dr. Iver. Could you please set up the Mri Urogram you ordered @ [facility] in [place]. [address]. The Fax # is [phone]. Also, when I called, I was told they need CONTRAST I have NEVER had an issue with contrast, just shellfish allergy, which I was told does not affect contrast. She asked that they put Abdomen/Pelvis ATTENTION UROGRAM. Iam hoping to get this done ASAP, Thank you so much! Late afternoon please

## 2024-03-05 ENCOUNTER — Encounter (HOSPITAL_COMMUNITY): Payer: Self-pay | Admitting: Family Medicine

## 2024-03-05 ENCOUNTER — Other Ambulatory Visit (HOSPITAL_COMMUNITY): Payer: Self-pay | Admitting: Family Medicine

## 2024-03-05 DIAGNOSIS — R319 Hematuria, unspecified: Secondary | ICD-10-CM

## 2024-03-05 DIAGNOSIS — R102 Pelvic and perineal pain unspecified side: Secondary | ICD-10-CM

## 2024-03-06 ENCOUNTER — Ambulatory Visit: Admitting: Sports Medicine

## 2024-03-27 ENCOUNTER — Ambulatory Visit (HOSPITAL_COMMUNITY)
Admission: RE | Admit: 2024-03-27 | Discharge: 2024-03-27 | Disposition: A | Discharge status: Home / Self Care | Source: Ambulatory Visit | Attending: Family Medicine | Admitting: Family Medicine

## 2024-03-27 DIAGNOSIS — R319 Hematuria, unspecified: Secondary | ICD-10-CM | POA: Insufficient documentation

## 2024-03-27 DIAGNOSIS — R102 Pelvic and perineal pain unspecified side: Secondary | ICD-10-CM | POA: Diagnosis present

## 2024-03-27 MED ORDER — GADOBUTROL 1 MMOL/ML IV SOLN
6.0000 mL | Freq: Once | INTRAVENOUS | Status: AC | PRN
  Administered 2024-03-27: 6 mL via INTRAVENOUS

## 2024-05-01 ENCOUNTER — Ambulatory Visit: Admitting: Urology
